# Patient Record
Sex: Male | Born: 1995 | Race: Black or African American | Hispanic: No | Marital: Single | Smoking: Never smoker
Health system: Southern US, Community
[De-identification: ages and names within clinical notes are randomized; demographics above are authoritative.]

## PROBLEM LIST (undated history)

## (undated) DIAGNOSIS — R51 Headache: Secondary | ICD-10-CM

## (undated) DIAGNOSIS — Z789 Other specified health status: Secondary | ICD-10-CM

## (undated) DIAGNOSIS — T7840XA Allergy, unspecified, initial encounter: Secondary | ICD-10-CM

---
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
## 2018-05-28 ENCOUNTER — Encounter (HOSPITAL_COMMUNITY): Payer: Self-pay | Admitting: Behavioral Health

## 2018-05-28 ENCOUNTER — Ambulatory Visit (HOSPITAL_COMMUNITY)
Admission: RE | Admit: 2018-05-28 | Discharge: 2018-05-28 | Disposition: A | Discharge status: Home / Self Care | Payer: Medicaid Other | Attending: Psychiatry | Admitting: Psychiatry

## 2018-05-28 DIAGNOSIS — Z833 Family history of diabetes mellitus: Secondary | ICD-10-CM | POA: Diagnosis not present

## 2018-05-28 DIAGNOSIS — F332 Major depressive disorder, recurrent severe without psychotic features: Secondary | ICD-10-CM | POA: Diagnosis not present

## 2018-05-28 DIAGNOSIS — Z809 Family history of malignant neoplasm, unspecified: Secondary | ICD-10-CM | POA: Insufficient documentation
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
## 2018-05-28 NOTE — H&P (Signed)
Behavioral Health Medical Screening Exam  Adrian Clayton is an 22 y.o. male patient [facility] accompanied by his parents with complaints of episodes of depression and anger that may last for a day before he calms down and passive suicidal thoughts but no intent or plan.  At this time patient denies suicidal/self-harm/homicidal ideation, psychosis, and paranoia.  Patient states he is seeking referral/resources for outpatient psychiatric services    Total Time spent with patient: 30 minutes  Psychiatric Specialty Exam: Physical Exam  Vitals reviewed. Constitutional: He is oriented to person, place, and time. He appears well-developed and well-nourished. No distress.  Neck: Normal range of motion. Neck supple.  Respiratory: Effort normal.  Musculoskeletal: Normal range of motion.  Neurological: He is alert and oriented to person, place, and time.  Skin: Skin is warm and dry.  Psychiatric: His speech is normal and behavior is normal. Judgment and thought content normal. His mood appears anxious. Cognition and memory are normal. He exhibits a depressed mood.    Review of Systems  Psychiatric/Behavioral: Positive for depression (Stable). Hallucinations: Denies. Memory loss: Denies. Substance abuse: Denies. Suicidal ideas: Denies at this time. The patient is nervous/anxious (Stable). Insomnia: Denies.   All other systems reviewed and are negative.   Blood pressure 130/76, temperature 98.8 F (37.1 C), resp. rate 18, SpO2 100 %.There is no height or weight on file to calculate BMI.  General Appearance: Casual  Eye Contact:  Good  Speech:  Clear and Coherent and Normal Rate  Volume:  Normal  Mood:  Anxious and Depressed  Affect:  Congruent  Thought Process:  Coherent and Goal Directed  Orientation:  Full (Time, Place, and Person)  Thought Content:  WDL and Logical  Suicidal Thoughts:  No  Homicidal Thoughts:  No  Memory:  Immediate;   Good Recent;   Good Remote;   Good   Judgement:  Intact  Insight:  Present  Psychomotor Activity:  Normal  Concentration: Concentration: Good and Attention Span: Good  Recall:  Good  Fund of Knowledge:Good  Language: Good  Akathisia:  No  Handed:  Right  AIMS (if indicated):     Assets:  Communication Skills Desire for Improvement Housing Physical Health Resilience Social Support  Sleep:       Musculoskeletal: Strength & Muscle Tone: within normal limits Gait & Station: normal Patient leans: N/A  Blood pressure 130/76, temperature 98.8 F (37.1 C), resp. rate 18, SpO2 100 %.  Recommendations: Outpatient psychiatric services.  Give resources and referral for psychiatric services Disposition: No evidence of imminent risk to self or others at present.   Patient does not meet criteria for psychiatric inpatient admission. Supportive therapy provided about ongoing stressors. Discussed crisis plan, support from social network, calling 911, coming to the Emergency Department, and calling Suicide Hotline.  Based on my evaluation the patient does not appear to have an emergency medical condition.  Shuvon Rankin, NP 05/28/2018, 12:17 PM

## 2019-05-11 ENCOUNTER — Other Ambulatory Visit: Payer: Self-pay

## 2019-05-11 ENCOUNTER — Emergency Department (HOSPITAL_COMMUNITY)
Admission: EM | Admit: 2019-05-11 | Discharge: 2019-05-12 | Disposition: A | Discharge status: Other Acute Inpatient Hospital | Payer: No Typology Code available for payment source | Attending: Emergency Medicine | Admitting: Emergency Medicine

## 2019-05-11 DIAGNOSIS — R45851 Suicidal ideations: Secondary | ICD-10-CM | POA: Insufficient documentation

## 2019-05-11 DIAGNOSIS — Z79899 Other long term (current) drug therapy: Secondary | ICD-10-CM | POA: Insufficient documentation

## 2019-05-11 DIAGNOSIS — T428X2A Poisoning by antiparkinsonism drugs and other central muscle-tone depressants, intentional self-harm, initial encounter: Secondary | ICD-10-CM | POA: Insufficient documentation

## 2019-05-11 DIAGNOSIS — F332 Major depressive disorder, recurrent severe without psychotic features: Secondary | ICD-10-CM | POA: Diagnosis not present

## 2019-05-11 DIAGNOSIS — R404 Transient alteration of awareness: Secondary | ICD-10-CM | POA: Diagnosis not present

## 2019-05-11 DIAGNOSIS — Z20828 Contact with and (suspected) exposure to other viral communicable diseases: Secondary | ICD-10-CM | POA: Insufficient documentation

## 2019-05-11 DIAGNOSIS — M5489 Other dorsalgia: Secondary | ICD-10-CM | POA: Diagnosis not present

## 2019-05-11 DIAGNOSIS — T50904A Poisoning by unspecified drugs, medicaments and biological substances, undetermined, initial encounter: Secondary | ICD-10-CM | POA: Diagnosis not present

## 2019-05-11 DIAGNOSIS — R030 Elevated blood-pressure reading, without diagnosis of hypertension: Secondary | ICD-10-CM

## 2019-05-11 DIAGNOSIS — T50902A Poisoning by unspecified drugs, medicaments and biological substances, intentional self-harm, initial encounter: Secondary | ICD-10-CM

## 2019-05-11 DIAGNOSIS — R52 Pain, unspecified: Secondary | ICD-10-CM | POA: Diagnosis not present

## 2019-05-11 DIAGNOSIS — T887XXA Unspecified adverse effect of drug or medicament, initial encounter: Secondary | ICD-10-CM | POA: Diagnosis not present

## 2019-05-11 LAB — CBC WITH DIFFERENTIAL/PLATELET
Abs Immature Granulocytes: 0.06 10*3/uL (ref 0.00–0.07)
Basophils Absolute: 0.1 10*3/uL (ref 0.0–0.1)
Basophils Relative: 1 %
Eosinophils Absolute: 0.1 10*3/uL (ref 0.0–0.5)
Eosinophils Relative: 1 %
HCT: 47.4 % (ref 39.0–52.0)
Hemoglobin: 15.1 g/dL (ref 13.0–17.0)
Immature Granulocytes: 1 %
Lymphocytes Relative: 35 %
Lymphs Abs: 4 10*3/uL (ref 0.7–4.0)
MCH: 27.4 pg (ref 26.0–34.0)
MCHC: 31.9 g/dL (ref 30.0–36.0)
MCV: 86 fL (ref 80.0–100.0)
Monocytes Absolute: 1.2 10*3/uL — ABNORMAL HIGH (ref 0.1–1.0)
Monocytes Relative: 10 %
Neutro Abs: 6 10*3/uL (ref 1.7–7.7)
Neutrophils Relative %: 52 %
Platelets: 356 10*3/uL (ref 150–400)
RBC: 5.51 MIL/uL (ref 4.22–5.81)
RDW: 11.9 % (ref 11.5–15.5)
WBC: 11.4 10*3/uL — ABNORMAL HIGH (ref 4.0–10.5)
nRBC: 0 % (ref 0.0–0.2)

## 2019-05-11 MED ORDER — SODIUM CHLORIDE 0.9 % IV BOLUS
1000.0000 mL | Freq: Once | INTRAVENOUS | Status: AC
  Administered 2019-05-12: 1000 mL via INTRAVENOUS

## 2019-05-11 MED ORDER — SODIUM CHLORIDE 0.9 % IV SOLN
Freq: Once | INTRAVENOUS | Status: AC
  Administered 2019-05-12: via INTRAVENOUS

## 2019-05-11 NOTE — ED Notes (Signed)
EKG given to EDP,Glick,MD., for review. 

## 2019-05-11 NOTE — ED Provider Notes (Signed)
[facility] DEPT Provider Note   CSN: [id number] Arrival date & time: 05/11/19  2309    History   Chief Complaint Chief Complaint  Patient presents with  . Drug Overdose    HPI Adrian Clayton is a 23 y.o. male.   The history is provided by the patient. The history is limited by the condition of the patient (Psychiatric disorder).  Drug Overdose  He states that he does not want to be here anymore and took an overdose of orphenadrine at about 9:30 PM.  He estimates that he took 15 tablets but is not sure how many.  He does admit to recent depression with crying spells, early morning awakening, anhedonia.  He denies hallucinations.  He does admit to prior suicide attempts.  He denies ethanol use and denies other drug use.  Past Medical History:  Diagnosis Date  . Allergy    seasonal   . Headache(784.0)     There are no active problems to display for this patient.   Past Surgical History:  Procedure Laterality Date  . TOOTH EXTRACTION N/A 11/14/2013   Procedure: EXTRACTION OF WISDOM TEETH AND REMOVAL OF CYST;  Surgeon: Gae Bon, DDS;  Location: [facility];  Service: Oral Surgery;  Laterality: N/A;        Home Medications    Prior to Admission medications   Medication Sig Start Date End Date Taking? Authorizing Provider  amoxicillin-clavulanate (AUGMENTIN) 875-125 MG per tablet Take 1 tablet by mouth 2 (two) times daily. 11/14/13   Diona Browner, DDS  loratadine-pseudoephedrine (CLARITIN-D 24 HOUR) 10-240 MG per 24 hr tablet Take 1 tablet by mouth daily. 11/14/13   Diona Browner, DDS  oxyCODONE-acetaminophen (PERCOCET) 5-325 MG per tablet Take 1-2 tablets by mouth every 4 (four) hours as needed for severe pain. 11/14/13   Diona Browner, DDS    Family History Family History  Problem Relation Age of Onset  . Hypertension Maternal Grandmother   . Diabetes Maternal Grandfather   . Heart disease Maternal Grandfather   . Cancer Maternal  Grandfather   . Diabetes Paternal Grandfather   . Hypertension Paternal Grandfather   . Hypertension Paternal Grandmother     Social History Social History   Tobacco Use  . Smoking status: Never Smoker  . Smokeless tobacco: Never Used  Substance Use Topics  . Alcohol use: No  . Drug use: No     Allergies   Patient has no known allergies.   Review of Systems Review of Systems  Unable to perform ROS: Psychiatric disorder     Physical Exam Updated Vital Signs BP (!) 169/101 (BP Location: Right Arm)   Pulse (!) 107   Temp 97.7 F (36.5 C) (Oral)   Resp (!) 32   SpO2 100%   Physical Exam Vitals signs and nursing note reviewed.    23 year old male, crying, but in no acute distress. Vital signs are significant for elevated respiratory rate, heart rate, blood pressure. Oxygen saturation is 100%, which is normal. Head is normocephalic and atraumatic. PERRLA, EOMI. Oropharynx is clear. Neck is nontender and supple without adenopathy or JVD. Back is nontender and there is no CVA tenderness. Lungs are clear without rales, wheezes, or rhonchi. Chest is nontender. Heart has regular rate and rhythm without murmur. Abdomen is soft, flat, nontender without masses or hepatosplenomegaly and peristalsis is normoactive. Extremities have no cyanosis or edema, full range of motion is present. Skin is warm and dry without rash. Neurologic: Awake and  will answer questions but is emotionally labile, cranial nerves are intact, there are no motor or sensory deficits.  ED Treatments / Results  Labs (all labs ordered are listed, but only abnormal results are displayed) Labs Reviewed - No data to display  EKG EKG Interpretation  Date/Time:  Sunday May 11 2019 23:27:33 EST Ventricular Rate:  94 PR Interval:    QRS Duration: 85 QT Interval:  345 QTC Calculation: 432 R Axis:   60 Text Interpretation: Sinus rhythm ST elev, probable normal early repol pattern No old tracing to  compare Confirmed by Dione Booze ([id number]) on 05/11/2019 11:34:25 PM   Radiology No results found.  Procedures Procedures  CRITICAL CARE Performed by: Dione Booze Total critical care time: 45 minutes Critical care time was exclusive of separately billable procedures and treating other patients. Critical care was necessary to treat or prevent imminent or life-threatening deterioration. Critical care was time spent personally by me on the following activities: development of treatment plan with patient and/or surrogate as well as nursing, discussions with consultants, evaluation of patient's response to treatment, examination of patient, obtaining history from patient or surrogate, ordering and performing treatments and interventions, ordering and review of laboratory studies, ordering and review of radiographic studies, pulse oximetry and re-evaluation of patient's condition.  Medications Ordered in ED Medications  sodium chloride 0.9 % bolus 1,000 mL (0 mLs Intravenous Stopped 05/12/19 0124)  0.9 %  sodium chloride infusion ( Intravenous New Bag/Given 05/12/19 0018)     Initial Impression / Assessment and Plan / ED Course  I have reviewed the triage vital signs and the nursing notes.  Pertinent lab results that were available during my care of the patient were reviewed by me and considered in my medical decision making (see chart for details).  Overdose of orphenadrine with suicidal intent.  Patient is currently awake and alert and protecting his airway appropriately.  Poison control has been consulted, and they recommend hydration and supportive care, observation for 12 hours.  ECG shows sinus tachycardia but with normal intervals.  Old records are reviewed, and he has no relevant past visits.  6:40 AM He has been hemodynamically stable through the night, although blood pressure is still mildly elevated.  He will need to be observed another 3 hours at which point TTS, will be obtained.   Case is signed out to Dr. Juleen China.  Final Clinical Impressions(s) / ED Diagnoses   Final diagnoses:  Suicide attempt by drug overdose (HCC)  Elevated blood pressure reading without diagnosis of hypertension    ED Discharge Orders    None       Dione Booze, MD 05/12/19 [phone]

## 2019-05-11 NOTE — ED Triage Notes (Signed)
Patient arrived via [facility] after taking roughly 15 100mg  Orphenadrine at 930 tonight. Intentional overdose for self harm. Lethargic but alerts to voice.

## 2019-05-12 ENCOUNTER — Encounter (HOSPITAL_COMMUNITY): Payer: Self-pay

## 2019-05-12 ENCOUNTER — Encounter (HOSPITAL_COMMUNITY): Payer: Self-pay | Admitting: Registered Nurse

## 2019-05-12 ENCOUNTER — Other Ambulatory Visit: Payer: Self-pay

## 2019-05-12 ENCOUNTER — Observation Stay (HOSPITAL_COMMUNITY)
Admission: AD | Admit: 2019-05-12 | Discharge: 2019-05-13 | Disposition: A | Discharge status: Home / Self Care | Payer: PRIVATE HEALTH INSURANCE | Source: Intra-hospital | Attending: Psychiatry | Admitting: Psychiatry

## 2019-05-12 DIAGNOSIS — F322 Major depressive disorder, single episode, severe without psychotic features: Secondary | ICD-10-CM | POA: Diagnosis present

## 2019-05-12 DIAGNOSIS — F419 Anxiety disorder, unspecified: Secondary | ICD-10-CM | POA: Diagnosis not present

## 2019-05-12 DIAGNOSIS — F332 Major depressive disorder, recurrent severe without psychotic features: Secondary | ICD-10-CM | POA: Diagnosis present

## 2019-05-12 DIAGNOSIS — R45851 Suicidal ideations: Secondary | ICD-10-CM | POA: Diagnosis not present

## 2019-05-12 HISTORY — DX: Other specified health status: Z78.9

## 2019-05-12 LAB — ETHANOL: Alcohol, Ethyl (B): <10 mg/dL (ref <10)

## 2019-05-12 LAB — COMPREHENSIVE METABOLIC PANEL
ALT: 20 U/L (ref 0–44)
AST: 22 U/L (ref 15–41)
Albumin: 4.6 g/dL (ref 3.5–5.0)
Alkaline Phosphatase: 84 U/L (ref 38–126)
Anion gap: 12 (ref 5–15)
BUN: 13 mg/dL (ref 6–20)
CO2: 23 mmol/L (ref 22–32)
Calcium: 9.5 mg/dL (ref 8.9–10.3)
Chloride: 103 mmol/L (ref 98–111)
Creatinine, Ser: 1.07 mg/dL (ref 0.61–1.24)
GFR calc Af Amer: >60 mL/min (ref >60)
GFR calc non Af Amer: >60 mL/min (ref >60)
Glucose, Bld: 98 mg/dL (ref 70–99)
Potassium: 3.6 mmol/L (ref 3.5–5.1)
Sodium: 138 mmol/L (ref 135–145)
Total Bilirubin: 0.9 mg/dL (ref 0.3–1.2)
Total Protein: 8.1 g/dL (ref 6.5–8.1)

## 2019-05-12 LAB — URINALYSIS, ROUTINE W REFLEX MICROSCOPIC
Bilirubin Urine: NEGATIVE
Glucose, UA: NEGATIVE mg/dL
Hgb urine dipstick: NEGATIVE
Ketones, ur: NEGATIVE mg/dL
Leukocytes,Ua: NEGATIVE
Nitrite: NEGATIVE
Protein, ur: NEGATIVE mg/dL
Specific Gravity, Urine: 1.018 (ref 1.005–1.030)
pH: 7 (ref 5.0–8.0)

## 2019-05-12 LAB — RAPID URINE DRUG SCREEN, HOSP PERFORMED
Amphetamines: NOT DETECTED
Barbiturates: NOT DETECTED
Benzodiazepines: NOT DETECTED
Cocaine: NOT DETECTED
Opiates: NOT DETECTED
Tetrahydrocannabinol: POSITIVE — AB

## 2019-05-12 LAB — ACETAMINOPHEN LEVEL: Acetaminophen (Tylenol), Serum: <10 ug/mL — ABNORMAL LOW (ref 10–30)

## 2019-05-12 LAB — SALICYLATE LEVEL: Salicylate Lvl: <7 mg/dL (ref 2.8–30.0)

## 2019-05-12 LAB — SARS CORONAVIRUS 2 (TAT 6-24 HRS): SARS Coronavirus 2: NEGATIVE

## 2019-05-12 MED ORDER — HYDROXYZINE HCL 25 MG PO TABS: 25.0000 mg | ORAL_TABLET | Freq: Three times a day (TID) | ORAL | Status: DC | PRN

## 2019-05-12 MED ORDER — IBUPROFEN 200 MG PO TABS
600.0000 mg | ORAL_TABLET | Freq: Once | ORAL | Status: AC
  Administered 2019-05-12: 600 mg via ORAL
  Filled 2019-05-12: qty 3

## 2019-05-12 MED ORDER — TRAZODONE HCL 50 MG PO TABS: 50.0000 mg | ORAL_TABLET | Freq: Every evening | ORAL | Status: DC | PRN

## 2019-05-12 NOTE — BH Assessment (Signed)
Assessment Note  Adrian Clayton is an 23 y.o. male.  Per EDP Shuvon, Rankin 05/12/19, "Adrian Clayton,23 y.o.,malepatient seen via tele psych by this provider, Dr. Lucianne MussKumar; and chart reviewed on12/07/20. On evaluation Adrian Gunterreports that he took pills in an attempt to overdose. States that he does not know the name of the medication because it wasn't his. Patient reports stressors are multiple things "financial, family.. I keep a lot of things bottled up in my head; someday's it's negative and I can deal with it and someday's anything just sets me off. I'm over thinking everything and certain things going on in my life."  During TTS assessment pt presented calm and cooperative. Pt denies current SI, HI, AVH, self injurious behaviors and any substance use.  Pt admits to drinking a beer a few nights ago but says he is a social drinker. Pt has no past SI attempts. Pt reports no family history of mental health issues, trauma/abuse. Pt reports no abuse for himself. Pt reports 3 to 4 hours of sleep and a poor appetite. Pt reports symptoms of depression: isolating self for months at a time, anxiety, irritation and insomnia. Pt has no prior psych history. Pt currently has no provider but open to getting therapy. Pt states he bottles up emotions and when stressors build up he does not talk about them and overthinks. Pt states job and family as stressors, pt states that grandmother is sick and he wants to be there for her but works 5 days a week. Pt denied criminal history, pt denied access to weapons. Pt denies history of violence. Pt states he wants to get better and open to getting therapy done to help him balance out his life.  Pt oriented x4.Adrian Clayton. Pt calm/cooperative; and mood is congruent with affect. He does not appear to be responding to internal/external stimuli or delusional thoughts. Patient denies homicidal ideation, psychosis, and paranoia; but continues to endorse suicidal ideation. Patient answered  question appropriately. Pt willing to get help.      Diagnosis: MDD (major depressive disorder), severe   Disposition: Per Nira ConnJason, Berry , NP recommends continued overnight observation, reassess by psychiatry in the am. [facility]    Past Medical History:  Past Medical History:  Diagnosis Date  . Allergy    seasonal   . Headache(784.0)   . Medical history non-contributory     Past Surgical History:  Procedure Laterality Date  . TOOTH EXTRACTION N/A 11/14/2013   Procedure: EXTRACTION OF WISDOM TEETH AND REMOVAL OF CYST;  Surgeon: Georgia LopesScott M Jensen, DDS;  Location: [facility];  Service: Oral Surgery;  Laterality: N/A;    Family History:  Family History  Problem Relation Age of Onset  . Hypertension Maternal Grandmother   . Diabetes Maternal Grandfather   . Heart disease Maternal Grandfather   . Cancer Maternal Grandfather   . Diabetes Paternal Grandfather   . Hypertension Paternal Grandfather   . Hypertension Paternal Grandmother     Social History:  reports that he has never smoked. He has never used smokeless tobacco. He reports that he does not drink alcohol or use drugs.  Additional Social History:  Alcohol / Drug Use Pain Medications: see MAR Prescriptions: see MAR Over the Counter: see MAR History of alcohol / drug use?: No history of alcohol / drug abuse  CIWA: CIWA-Ar BP: (!) 150/82 Pulse Rate: 66 Nausea and Vomiting: no nausea and no vomiting Tactile Disturbances: none Tremor: no tremor Auditory Disturbances: not present Paroxysmal Sweats:  no sweat visible Visual Disturbances: not present Anxiety: no anxiety, at ease Headache, Fullness in Head: none present Agitation: normal activity Orientation and Clouding of Sensorium: oriented and can do serial additions CIWA-Ar Total: 0 COWS:    Allergies: No Known Allergies  Home Medications:  No medications prior to admission.    OB/GYN Status:  No LMP for male patient.  General  Assessment Data Location of Assessment: [facility] Assessment Services TTS Assessment: In system Is this a Tele or Face-to-Face Assessment?: Face-to-Face Is this an Initial Assessment or a Re-assessment for this encounter?: Initial Assessment Patient Accompanied by:: N/A Language Other than English: No Living Arrangements: Other (Comment) What gender do you identify as?: Male Marital status: Other (comment) Living Arrangements: Spouse/significant other Can pt return to current living arrangement?: Yes Admission Status: Voluntary Is patient capable of signing voluntary admission?: Yes Referral Source: Self/Family/Friend Insurance type: none     Crisis Care Plan Living Arrangements: Spouse/significant other Name of Psychiatrist: none Name of Therapist: none     Risk to self with the past 6 months Has patient been a risk to self within the past 6 months prior to admission? : No Suicidal Intent: Yes-Currently Present Has patient had any suicidal intent within the past 6 months prior to admission? : No Is patient at risk for suicide?: No Suicidal Plan?: No Has patient had any suicidal plan within the past 6 months prior to admission? : No Access to Means: No What has been your use of drugs/alcohol within the last 12 months?: none Previous Attempts/Gestures: No How many times?: 0 Other Self Harm Risks: none Triggers for Past Attempts: None known Intentional Self Injurious Behavior: None Family Suicide History: No Recent stressful life event(s): Conflict (Comment), Other (Comment) Persecutory voices/beliefs?: No Depression: Yes Depression Symptoms: Feeling worthless/self pity, Isolating, Insomnia Substance abuse history and/or treatment for substance abuse?: No Suicide prevention information given to non-admitted patients: Not applicable  Risk to Others within the past 6 months Homicidal Ideation: No Does patient have any lifetime risk of violence toward others beyond the six  months prior to admission? : No Thoughts of Harm to Others: No Current Homicidal Intent: No Current Homicidal Plan: No Access to Homicidal Means: No History of harm to others?: No Assessment of Violence: None Noted Does patient have access to weapons?: No Criminal Charges Pending?: No Does patient have a court date: No Is patient on probation?: No  Psychosis Hallucinations: None noted Delusions: None noted  Mental Status Report Appearance/Hygiene: In scrubs Eye Contact: Good Motor Activity: Freedom of movement Speech: Logical/coherent Level of Consciousness: Alert Mood: Pleasant Affect: Appropriate to circumstance Anxiety Level: Minimal Thought Processes: Coherent, Relevant Judgement: Partial Orientation: Person, Place, Time, Situation Obsessive Compulsive Thoughts/Behaviors: None  Cognitive Functioning Concentration: Good Memory: Recent Intact Is patient IDD: No Insight: Good Impulse Control: Poor Appetite: Poor Have you had any weight changes? : No Change Sleep: No Change Total Hours of Sleep: 4 Vegetative Symptoms: None  ADLScreening [facility] Assessment Services) Patient's cognitive ability adequate to safely complete daily activities?: Yes Patient able to express need for assistance with ADLs?: Yes Independently performs ADLs?: Yes (appropriate for developmental age)  Prior Inpatient Therapy Prior Inpatient Therapy: No  Prior Outpatient Therapy Prior Outpatient Therapy: No Does patient have an ACCT team?: No Does patient have Intensive In-House Services?  : No Does patient have [facility] services? : No Does patient have P4CC services?: No  ADL Screening (condition at time of admission) Patient's cognitive ability adequate to safely complete daily activities?:  Yes Is the patient deaf or have difficulty hearing?: No Does the patient have difficulty seeing, even when wearing glasses/contacts?: No Does the patient have difficulty concentrating, remembering, or  making decisions?: No Patient able to express need for assistance with ADLs?: Yes Does the patient have difficulty dressing or bathing?: No Independently performs ADLs?: Yes (appropriate for developmental age) Does the patient have difficulty walking or climbing stairs?: No Weakness of Legs: None Weakness of Arms/Hands: None  Home Assistive Devices/Equipment Home Assistive Devices/Equipment: None  Therapy Consults (therapy consults require a physician order) PT Evaluation Needed: No OT Evalulation Needed: No SLP Evaluation Needed: No Abuse/Neglect Assessment (Assessment to be complete while patient is alone) Abuse/Neglect Assessment Can Be Completed: Yes Physical Abuse: Denies Verbal Abuse: Denies Sexual Abuse: Denies Exploitation of patient/patient's resources: Denies Self-Neglect: Denies Values / Beliefs Cultural Requests During Hospitalization: None Spiritual Requests During Hospitalization: None Consults Spiritual Care Consult Needed: No Social Work Consult Needed: No Merchant navy officer (For Healthcare) Does Patient Have a Medical Advance Directive?: No Would patient like information on creating a medical advance directive?: No - Patient declined Nutrition Screen- [facility] Adult/[facility]/AP Patient's home diet: Regular Has the patient recently lost weight without trying?: Patient is unsure Has the patient been eating poorly because of a decreased appetite?: Yes Malnutrition Screening Tool Score: 3        Disposition: Per Nira Conn , NP recommends continued overnight observation, reassess by psychiatry in the am. Confirmed status with [facility] Fransico Michael Disposition Initial Assessment Completed for this Encounter: Yes  On Site Evaluation by:  Lacey Jensen, Theresia Majors Reviewed with Physician:  Nira Conn, NP  Claria Dice Aalayah Riles 05/12/2019 8:49 PM

## 2019-05-12 NOTE — ED Notes (Signed)
Patients belongings gathered and placed in assigned cabinets behind nurses station.

## 2019-05-12 NOTE — ED Notes (Signed)
Gave report to [place], Therapist, sports at [facility] observation.

## 2019-05-12 NOTE — Consult Note (Signed)
  [name], 23 y.o., male patient seen via tele psych by this provider, Dr. Dwyane Dee; and chart reviewed on 05/12/19.  On evaluation Lysander Calixte reports that he took pills in an attempt to overdose.  States that he does not know the name of the medication because it wasn't his.  Patient reports stressors are multiple things "financial, family.. I keep a lot of things bottled up in my head; someday's it's negative and I can deal with it and someday's anything just sets me off. I'm over thinking everything and certain things going on in my life." During evaluation Jaiden Dinkins is alert/oriented x 4; calm/cooperative; and mood is congruent with affect.  He does not appear to be responding to internal/external stimuli or delusional thoughts.  Patient denies homicidal ideation, psychosis, and paranoia; but continues to endorse suicidal ideation.  Patient answered question appropriately.    Recommendation: Observation.  Check to see if [facility] has an bed in observation available.  Reassess tomorrow

## 2019-05-12 NOTE — ED Notes (Signed)
Pt walked to restroom w/o assistance. 

## 2019-05-12 NOTE — Plan of Care (Signed)
[facility] Observation Crisis Plan  Reason for Crisis Plan:  Crisis Stabilization   Plan of Care:  Referral for IOP  Family Support:      Current Living Environment:  Living Arrangements: Spouse/significant other  Insurance:   Hospital Account    Name Acct ID Class Status Primary Coverage   Adrian Clayton, Adrian Clayton [id number] [place]        Guarantor Account (for Hospital Account [id number])    Name Relation to Pt Service Area Active? Acct Type   Adrian Clayton Self [facility] Yes Behavioral Health   Address Phone       [address] [place], [place] [address] [phone]          Coverage Information (for Hospital Account [id number])    F/O Payor/Plan Precert #   [employer]    Subscriber Subscriber #   Adrian Clayton, Adrian Clayton [id number]   Address Phone   [address] [place], [place] [address] [phone]      Legal Guardian:     Primary Care Provider:  Wayna Chalet, MD  Current Outpatient Providers:  none  Psychiatrist:     Counselor/Therapist:     Compliant with Medications:  Yes  Additional Information:   Adrian Clayton 12/7/20206:47 PM

## 2019-05-12 NOTE — H&P (Signed)
[facility] Provider Admission PAA/H&P  Patient Identification: Adrian Clayton MRN:  [id number] Date of Evaluation:  05/12/2019 Chief Complaint:  mdd Principal Diagnosis: MDD (major depressive disorder), recurrent severe, without psychosis ([place]) Diagnosis:  Principal Problem:   MDD (major depressive disorder), recurrent severe, without psychosis ([place]) Active Problems:   Suicidal ideation   MDD (major depressive disorder), severe ([place])  History of Present Illness: Adrian Clayton, 23 y.o., male patient seen via tele psych by this provider, Dr. Dwyane Dee; and chart reviewed on 05/12/19.  On evaluation Adrian Clayton reports that he took pills in an attempt to overdose.  States that he does not know the name of the medication because it wasn't his.  Patient reports stressors are multiple things "financial, family.. I keep a lot of things bottled up in my head; someday's it's negative and I can deal with it and someday's anything just sets me off. I'm over thinking everything and certain things going on in my life." During evaluation Adrian Clayton is alert/oriented x 4; calm/cooperative; and mood is congruent with affect.  He does not appear to be responding to internal/external stimuli or delusional thoughts.  Patient denies homicidal ideation, psychosis, and paranoia; but continues to endorse suicidal ideation.  Patient answered question appropriately.  Associated Signs/Symptoms: Depression Symptoms:  depressed mood, anhedonia, recurrent thoughts of death, suicidal thoughts without plan, anxiety, (Hypo) Manic Symptoms:  Distractibility, Impulsivity, Irritable Mood, Anxiety Symptoms:  Excessive Worry, Psychotic Symptoms:  Denies PTSD Symptoms: NA Total Time spent with patient: 30 minutes  Past Psychiatric History: Depression  Is the patient at risk to self? No.  Has the patient been a risk to self in the past 6 months? No.  Has the patient been a risk to self within the distant past? No.  Is  the patient a risk to others? No.  Has the patient been a risk to others in the past 6 months? No.  Has the patient been a risk to others within the distant past? No.   Prior Inpatient Therapy:   Prior Outpatient Therapy:    Alcohol Screening: 1. How often do you have a drink containing alcohol?: Never 2. How many drinks containing alcohol do you have on a typical day when you are drinking?: 1 or 2 3. How often do you have six or more drinks on one occasion?: Never AUDIT-C Score: 0 4. How often during the last year have you found that you were not able to stop drinking once you had started?: Never 5. How often during the last year have you failed to do what was normally expected from you becasue of drinking?: Never 6. How often during the last year have you needed a first drink in the morning to get yourself going after a heavy drinking session?: Never 7. How often during the last year have you had a feeling of guilt of remorse after drinking?: Never 8. How often during the last year have you been unable to remember what happened the night before because you had been drinking?: Never 9. Have you or someone else been injured as a result of your drinking?: No 10. Has a relative or friend or a doctor or another health worker been concerned about your drinking or suggested you cut down?: No Alcohol Use Disorder Identification Test Final Score (AUDIT): 0 Substance Abuse History in the last 12 months:  Yes.   Consequences of Substance Abuse: Family Consequences:  family discord Previous Psychotropic Medications: No  Psychological Evaluations: No  Past Medical History:  Past Medical History:  Diagnosis Date  . Allergy    seasonal   . Headache(784.0)   . Medical history non-contributory     Past Surgical History:  Procedure Laterality Date  . TOOTH EXTRACTION N/A 11/14/2013   Procedure: EXTRACTION OF WISDOM TEETH AND REMOVAL OF CYST;  Surgeon: Georgia Lopes, DDS;  Location: [facility] OR;   Service: Oral Surgery;  Laterality: N/A;   Family History:  Family History  Problem Relation Age of Onset  . Hypertension Maternal Grandmother   . Diabetes Maternal Grandfather   . Heart disease Maternal Grandfather   . Cancer Maternal Grandfather   . Diabetes Paternal Grandfather   . Hypertension Paternal Grandfather   . Hypertension Paternal Grandmother    Family Psychiatric History: Denies  Tobacco Screening:   Social History:  Social History   Substance and Sexual Activity  Alcohol Use No     Social History   Substance and Sexual Activity  Drug Use No    Additional Social History:                           Allergies:  No Known Allergies Lab Results:  Results for orders placed or performed during the hospital encounter of 05/11/19 (from the past 48 hour(s))  Comprehensive metabolic panel     Status: None   Collection Time: 05/11/19 11:44 PM  Result Value Ref Range   Sodium 138 135 - 145 mmol/L   Potassium 3.6 3.5 - 5.1 mmol/L   Chloride 103 98 - 111 mmol/L   CO2 23 22 - 32 mmol/L   Glucose, Bld 98 70 - 99 mg/dL   BUN 13 6 - 20 mg/dL   Creatinine, Ser 4.09 0.61 - 1.24 mg/dL   Calcium 9.5 8.9 - 81.1 mg/dL   Total Protein 8.1 6.5 - 8.1 g/dL   Albumin 4.6 3.5 - 5.0 g/dL   AST 22 15 - 41 U/L   ALT 20 0 - 44 U/L   Alkaline Phosphatase 84 38 - 126 U/L   Total Bilirubin 0.9 0.3 - 1.2 mg/dL   GFR calc non Af Amer >60 >60 mL/min   GFR calc Af Amer >60 >60 mL/min   Anion gap 12 5 - 15    Comment: Performed at [facility], [address] [place], [place] [address]  Ethanol     Status: None   Collection Time: 05/11/19 11:44 PM  Result Value Ref Range   Alcohol, Ethyl (B) <10 <10 mg/dL    Comment: (NOTE) Lowest detectable limit for serum alcohol is 10 mg/dL. For medical purposes only. Performed at [facility], [address], [place], [place] [address]   CBC with Differential     Status: Abnormal   Collection  Time: 05/11/19 11:44 PM  Result Value Ref Range   WBC 11.4 (H) 4.0 - 10.5 K/uL   RBC 5.51 4.22 - 5.81 MIL/uL   Hemoglobin 15.1 13.0 - 17.0 g/dL   HCT 13.0 86.5 - 78.4 %   MCV 86.0 80.0 - 100.0 fL   MCH 27.4 26.0 - 34.0 pg   MCHC 31.9 30.0 - 36.0 g/dL   RDW 69.6 29.5 - 28.4 %   Platelets 356 150 - 400 K/uL   nRBC 0.0 0.0 - 0.2 %   Neutrophils Relative % 52 %   Neutro Abs 6.0 1.7 - 7.7 K/uL   Lymphocytes Relative 35 %   Lymphs Abs 4.0 0.7 -  4.0 K/uL   Monocytes Relative 10 %   Monocytes Absolute 1.2 (H) 0.1 - 1.0 K/uL   Eosinophils Relative 1 %   Eosinophils Absolute 0.1 0.0 - 0.5 K/uL   Basophils Relative 1 %   Basophils Absolute 0.1 0.0 - 0.1 K/uL   Immature Granulocytes 1 %   Abs Immature Granulocytes 0.06 0.00 - 0.07 K/uL    Comment: Performed at [facility], [address] [place], [place] [address]  Salicylate level     Status: None   Collection Time: 05/11/19 11:44 PM  Result Value Ref Range   Salicylate Lvl <7.0 2.8 - 30.0 mg/dL    Comment: Performed at [facility], [address] [place], [place] [address]  Acetaminophen level     Status: Abnormal   Collection Time: 05/11/19 11:44 PM  Result Value Ref Range   Acetaminophen (Tylenol), Serum <10 (L) 10 - 30 ug/mL    Comment: (NOTE) Therapeutic concentrations vary significantly. A range of 10-30 ug/mL  may be an effective concentration for many patients. However, some  are best treated at concentrations outside of this range. Acetaminophen concentrations >150 ug/mL at 4 hours after ingestion  and >50 ug/mL at 12 hours after ingestion are often associated with  toxic reactions. Performed at [facility], [address] [place], [place] [address]   Urinalysis, Routine w reflex microscopic     Status: None   Collection Time: 05/11/19 11:45 PM  Result Value Ref Range   Color, Urine YELLOW YELLOW   APPearance CLEAR CLEAR   Specific Gravity, Urine 1.018 1.005  - 1.030   pH 7.0 5.0 - 8.0   Glucose, UA NEGATIVE NEGATIVE mg/dL   Hgb urine dipstick NEGATIVE NEGATIVE   Bilirubin Urine NEGATIVE NEGATIVE   Ketones, ur NEGATIVE NEGATIVE mg/dL   Protein, ur NEGATIVE NEGATIVE mg/dL   Nitrite NEGATIVE NEGATIVE   Leukocytes,Ua NEGATIVE NEGATIVE    Comment: Performed at [facility], [address] [place], [place] [address]  Urine rapid drug screen (hosp performed)     Status: Abnormal   Collection Time: 05/11/19 11:45 PM  Result Value Ref Range   Opiates NONE DETECTED NONE DETECTED   Cocaine NONE DETECTED NONE DETECTED   Benzodiazepines NONE DETECTED NONE DETECTED   Amphetamines NONE DETECTED NONE DETECTED   Tetrahydrocannabinol POSITIVE (A) NONE DETECTED   Barbiturates NONE DETECTED NONE DETECTED    Comment: (NOTE) DRUG SCREEN FOR MEDICAL PURPOSES ONLY.  IF CONFIRMATION IS NEEDED FOR ANY PURPOSE, NOTIFY LAB WITHIN 5 DAYS. LOWEST DETECTABLE LIMITS FOR URINE DRUG SCREEN Drug Class                     Cutoff (ng/mL) Amphetamine and metabolites    1000 Barbiturate and metabolites    200 Benzodiazepine                 200 Tricyclics and metabolites     300 Opiates and metabolites        300 Cocaine and metabolites        300 THC                            50 Performed at [facility], [address] [place], [place] [address]   SARS CORONAVIRUS 2 (TAT 6-24 HRS) Nasopharyngeal Nasopharyngeal Swab     Status: None   Collection Time: 05/11/19 11:49 PM   Specimen:  Nasopharyngeal Swab  Result Value Ref Range   SARS Coronavirus 2 NEGATIVE NEGATIVE    Comment: (NOTE) SARS-CoV-2 target nucleic acids are NOT DETECTED. The SARS-CoV-2 RNA is generally detectable in upper and lower respiratory specimens during the acute phase of infection. Negative results do not preclude SARS-CoV-2 infection, do not rule out co-infections with other pathogens, and should not be used as the sole basis for treatment or other  patient management decisions. Negative results must be combined with clinical observations, patient history, and epidemiological information. The expected result is Negative. Fact Sheet for Patients: HairSlick.no Fact Sheet for Healthcare Providers: quierodirigir.com This test is not yet approved or cleared by the [place] FDA and  has been authorized for detection and/or diagnosis of SARS-CoV-2 by FDA under an Emergency Use Authorization (EUA). This EUA will remain  in effect (meaning this test can be used) for the duration of the COVID-19 declaration under Section 56 4(b)(1) of the Act, 21 U.S.C. section 360bbb-3(b)(1), unless the authorization is terminated or revoked sooner. Performed at [facility], [address] [place], [place] [address]     Blood Alcohol level:  Lab Results  Component Value Date   ETH <10 05/11/2019    Metabolic Disorder Labs:  No results found for: HGBA1C, MPG No results found for: PROLACTIN No results found for: CHOL, TRIG, HDL, CHOLHDL, VLDL, LDLCALC  Current Medications: No current facility-administered medications for this encounter.    PTA Medications: No medications prior to admission.    Musculoskeletal: Strength & Muscle Tone: within normal limits Gait & Station: normal Patient leans: N/A  Psychiatric Specialty Exam: Physical Exam  ROS  Blood pressure (!) 150/82, pulse 66, temperature 98.7 F (37.1 C), temperature source Oral, resp. rate 18, height 5\' 8"  (1.727 m), weight 106.6 kg, SpO2 100 %.Body mass index is 35.73 kg/m.  General Appearance: Casual  Eye Contact:  Good  Speech:  Clear and Coherent and Normal Rate  Volume:  Normal  Mood:  Depressed  Affect:  Congruent and Depressed  Thought Process:  Coherent, Goal Directed and Descriptions of Associations: Intact  Orientation:  Full (Time, Place, and Person)  Thought Content:  WDL  Suicidal Thoughts:  Yes.   with intent/plan  Homicidal Thoughts:  No  Memory:  Immediate;   Good Recent;   Good  Judgement:  Intact  Insight:  Fair and Present  Psychomotor Activity:  Normal  Concentration:  Concentration: Good and Attention Span: Good  Recall:  Good  Fund of Knowledge:  Good  Language:  Good  Akathisia:  No  Handed:  Right  AIMS (if indicated):     Assets:  Communication Skills Desire for Improvement Housing Social Support  ADL's:  Intact  Cognition:  WNL  Sleep:         Treatment Plan Summary: Plan Observe overnight reassess tomorrow   Observation Level/Precautions:  15 minute checks Laboratory:  CBC Chemistry Profile UDS UA Psychotherapy:  As needed Medications:  No medications started at this time Consultations:  As needed Discharge Concerns:  Safety, stabilization, and access to medication Estimated LOS: Over night Other:       , NP 12/7/20206:58 PM

## 2019-05-12 NOTE — BH Assessment (Signed)
[place] Assessment Progress Note  Per Shuvon Rankin, FNP, this pt would benefit from admission to the [facility] Observation Unit at this time.  Heather, RN has assigned pt to Obs Rm 402-1.  Pt has signed Voluntary Admission and Consent for Treatment, as well as Consent to Release Information to pt's mother, and signed forms have been faxed to [facility].  Pt's nurse has been notified, and agrees to send original paperwork along with pt via [facility], and to call report to [phone].  Jalene Mullet, [place] Coordinator [phone]

## 2019-05-12 NOTE — ED Notes (Addendum)
Explained to patient about going to [facility] observation. Also, called [facility] for patient.

## 2019-05-12 NOTE — Progress Notes (Signed)
Pt is a 23 year old male admitted to the observation unit.  Pt reported that he has felt overwhelmed and depressed lately and he attempted to overdose on his girlfriend's muscle relaxants a few nights ago. Pt reported that he was not sure how many pills he took.   Pt said that he has been dealing with many recent stressors in his life such as  financial difficulties, recent move from [place] to [place] for work, his grandmother has been significantly ill, and both of his parents (who are divorced) recently got remarried suddently which pt said that he had no idea either parent "was even thinking about getting remarried".  Pt said that he had an episode a few years ago when he got very depressed and had suicidal thoughts but he had no plan.  Pt said that his father intervened during this episode before pt could attempt to do anything to harm himself. RN conducted skin assessment, no skin issues were noted.    RN oriented pt to the unit, room and staff. RN initiated q 15 minute safety checks.

## 2019-05-12 NOTE — ED Notes (Signed)
[facility] is here to pick patient up for transport. He ambulated with sitter to the front ED entrance with belongings.

## 2019-05-12 NOTE — ED Notes (Signed)
Updated Adrian Clayton from poison control about patients care, will follow up again at 1000

## 2019-05-12 NOTE — ED Notes (Signed)
Meal tray was offered. Patient declined food.

## 2019-05-13 ENCOUNTER — Encounter (HOSPITAL_COMMUNITY): Payer: Self-pay | Admitting: Registered Nurse

## 2019-05-13 DIAGNOSIS — F332 Major depressive disorder, recurrent severe without psychotic features: Secondary | ICD-10-CM | POA: Diagnosis not present

## 2019-05-13 DIAGNOSIS — R45851 Suicidal ideations: Secondary | ICD-10-CM | POA: Diagnosis not present

## 2019-05-13 DIAGNOSIS — T50902A Poisoning by unspecified drugs, medicaments and biological substances, intentional self-harm, initial encounter: Secondary | ICD-10-CM

## 2019-05-13 MED ORDER — TRAZODONE HCL 50 MG PO TABS: 50.0000 mg | ORAL_TABLET | Freq: Every evening | ORAL | 0 refills | Status: DC | PRN

## 2019-05-13 MED ORDER — HYDROXYZINE HCL 10 MG PO TABS: 10.0000 mg | ORAL_TABLET | Freq: Three times a day (TID) | ORAL | 0 refills | Status: DC | PRN

## 2019-05-13 MED ORDER — HYDROXYZINE HCL 10 MG PO TABS: 10.0000 mg | ORAL_TABLET | Freq: Three times a day (TID) | ORAL | Status: DC | PRN

## 2019-05-13 MED ORDER — ESCITALOPRAM OXALATE 5 MG PO TABS
5.0000 mg | ORAL_TABLET | Freq: Every day | ORAL | Status: DC
  Administered 2019-05-13: 5 mg via ORAL
  Filled 2019-05-13: qty 1

## 2019-05-13 MED ORDER — ESCITALOPRAM OXALATE 5 MG PO TABS: 5.0000 mg | ORAL_TABLET | Freq: Every day | ORAL | 0 refills | Status: DC

## 2019-05-13 NOTE — Progress Notes (Signed)
Adrian Clayton NOVEL CORONAVIRUS (COVID-19) DAILY CHECK-OFF SYMPTOMS - answer yes or no to each - every day NO YES  Have you had a fever in the past 24 hours?  . Fever (Temp > 37.80C / 100F) X   Have you had any of these symptoms in the past 24 hours? . New Cough .  Sore Throat  .  Shortness of Breath .  Difficulty Breathing .  Unexplained Body Aches   X   Have you had any one of these symptoms in the past 24 hours not related to allergies?   . Runny Nose .  Nasal Congestion .  Sneezing   X   If you have had runny nose, nasal congestion, sneezing in the past 24 hours, has it worsened?  X   EXPOSURES - check yes or no X   Have you traveled outside the state in the past 14 days?  X   Have you been in contact with someone with a confirmed diagnosis of COVID-19 or PUI in the past 14 days without wearing appropriate PPE?  X   Have you been living in the same home as a person with confirmed diagnosis of COVID-19 or a PUI (household contact)?    X   Have you been diagnosed with COVID-19?    X              What to do next: Answered NO to all: Answered YES to anything:   Proceed with unit schedule Follow the [facility] Inpatient Flowsheet.   

## 2019-05-13 NOTE — BH Assessment (Addendum)
[place] Assessment Progress Note  Per Shuvon Rankin, FNP, this pt requires psychiatric hospitalization at this time.  Heather, RN has assigned pt to [facility] Rm 302-2.  Pt's nurse, Louanne Skye, has been notified.  Jalene Mullet, [place] [phone]   Addendum:  After further consideration, Delphia Grates has determined that pt does not require psychiatric hospitalization.  Pt is to be discharged from the [facility] Observation Unit with recommendation to follow up with [facility] in [place], pt's community of residence.  This has been included in pt's discharge instructions.  Pt's nurse, Louanne Skye, has been notified, as has Water quality scientist.  Jalene Mullet, [place] Coordinator [phone]

## 2019-05-13 NOTE — Discharge Summary (Addendum)
[facility] Psych Observation Discharge  05/13/2019 2:38 PM Adrian Clayton  MRN:  [id number] Principal Problem: MDD (major depressive disorder), recurrent severe, without psychosis (HCC) Discharge Diagnoses: Principal Problem:   MDD (major depressive disorder), recurrent severe, without psychosis (HCC) Active Problems:   Suicidal ideation   MDD (major depressive disorder), severe (HCC)   Subjective: Adrian Clayton, 23 y.o., male patient seen via tele psych by this provider, Dr. Jola Babinski; and chart reviewed on 05/13/19.  On evaluation Adrian Clayton reports feeling overwhelmed and depressed about life "financial, work, and family.  Patient denies prior psychiatric history.  States that he has had passive suicidal thoughts off and on but has never attempted to kill himself.  At this time patient denies suicidal/self-harm/homicidal ideation, psychosis, and paranoia.  Patient states that he currently lives with his grandmother, but plans to move in with girlfriend.  Patient states that he is employed also.  Patient seeking outpatient psychiatric services.  "Some days I deal with things just fine and other days they get to me.  I just need someone to talk to."  Patient denies illicit drug use and occasional alcohol use.  Patient gave permission to speak with his girlfriend and grandmother.   During evaluation Adrian Clayton is alert/oriented x 4; calm/cooperative; and mood is congruent with affect.  He does not appear to be responding to internal/external stimuli or delusional thoughts.  Patient denies suicidal/self-harm/homicidal ideation, psychosis, and paranoia.  Patient answered question appropriately.    Collateral information Girlfriend Sharyl Nimrod [phone]) states that she and patient has been dating since August but had seen each other daily at work.  States that she has no concerns that he will hurt himself.  States that patient has complained about his finances stating that "life and bills, is a everyday  struggle; we just work and stay behind"  States that she feels the patient is safe to come home and that he could use outpatient psychiatric services Grandmother Jamse Mead [phone])  States that patient has been living with her for a while and that she has not noticed any problems with patient states "he has been doing good, and he is a big help to me.  He is the only grandchild staying here.  States that she does not feel that patient will do anything to hurt himself and states that as far as she knows patient has never tried to hurt himself or has a psychiatric history.  Total Time spent with patient: 45 minutes  Past Psychiatric History: No prior psych history  Past Medical History:  Past Medical History:  Diagnosis Date  . Allergy    seasonal   . Headache(784.0)   . Medical history non-contributory     Past Surgical History:  Procedure Laterality Date  . TOOTH EXTRACTION N/A 11/14/2013   Procedure: EXTRACTION OF WISDOM TEETH AND REMOVAL OF CYST;  Surgeon: Georgia Lopes, DDS;  Location: [facility] OR;  Service: Oral Surgery;  Laterality: N/A;   Family History:  Family History  Problem Relation Age of Onset  . Hypertension Maternal Grandmother   . Diabetes Maternal Grandfather   . Heart disease Maternal Grandfather   . Cancer Maternal Grandfather   . Diabetes Paternal Grandfather   . Hypertension Paternal Grandfather   . Hypertension Paternal Grandmother    Family Psychiatric  History: Denis Social History:  Social History   Substance and Sexual Activity  Alcohol Use No     Social History   Substance and Sexual Activity  Drug Use No  Social History   Socioeconomic History  . Marital status: Single    Spouse name: Not on file  . Number of children: Not on file  . Years of education: Not on file  . Highest education level: Not on file  Occupational History  . Not on file  Social Needs  . Financial resource strain: Not on file  . Food insecurity    Worry: Not  on file    Inability: Not on file  . Transportation needs    Medical: Not on file    Non-medical: Not on file  Tobacco Use  . Smoking status: Never Smoker  . Smokeless tobacco: Never Used  Substance and Sexual Activity  . Alcohol use: No  . Drug use: No  . Sexual activity: Yes  Lifestyle  . Physical activity    Days per week: Not on file    Minutes per session: Not on file  . Stress: Not on file  Relationships  . Social Musicianconnections    Talks on phone: Not on file    Gets together: Not on file    Attends religious service: Not on file    Active member of club or organization: Not on file    Attends meetings of clubs or organizations: Not on file    Relationship status: Not on file  Other Topics Concern  . Not on file  Social History Narrative   2015-2016 11th grade    Has this patient used any form of tobacco in the last 30 days? (Cigarettes, Smokeless Tobacco, Cigars, and/or Pipes) Prescription not provided because: Patient does not use tobacco products  Current Medications: Current Facility-Administered Medications  Medication Dose Route Frequency Provider Last Rate Last Dose  . escitalopram (LEXAPRO) tablet 5 mg  5 mg Oral Daily Antonieta Pertlary, Khyri Hinzman Lawson, MD   5 mg at 05/13/19 1112  . hydrOXYzine (ATARAX/VISTARIL) tablet 10 mg  10 mg Oral TID PRN Antonieta Pertlary, Marce Schartz Lawson, MD      . traZODone (DESYREL) tablet 50 mg  50 mg Oral QHS,MR X 1 Nira ConnBerry, Jason A, NP       PTA Medications: No medications prior to admission.    Musculoskeletal: Strength & Muscle Tone: within normal limits Gait & Station: normal Patient leans: N/A  Psychiatric Specialty Exam: Physical Exam  Nursing note and vitals reviewed. Constitutional: He is oriented to person, place, and time. He appears well-developed and well-nourished. No distress.  Neck: Normal range of motion.  Respiratory: Effort normal.  Musculoskeletal: Normal range of motion.  Neurological: He is alert and oriented to person, place, and  time.  Skin: Skin is warm and dry.    Review of Systems  Psychiatric/Behavioral: Depression: Stable. Hallucinations: Denies. Memory loss: Denies. Substance abuse: Denies. Suicidal ideas: Denies. Nervous/anxious: Stable. Insomnia: Stable.   All other systems reviewed and are negative.   Blood pressure (!) 150/82, pulse 66, temperature 98.7 F (37.1 C), temperature source Oral, resp. rate 18, height 5\' 8"  (1.727 m), weight 106.6 kg, SpO2 100 %.Body mass index is 35.73 kg/m.  General Appearance: Casual  Eye Contact:  Good  Speech:  Clear and Coherent and Normal Rate  Volume:  Normal  Mood:  "Fine" appropriate  Affect:  Appropriate and Congruent  Thought Process:  Coherent, Goal Directed and Descriptions of Associations: Intact  Orientation:  Full (Time, Place, and Person)  Thought Content:  WDL and Logical  Suicidal Thoughts:  No  Homicidal Thoughts:  No  Memory:  Immediate;   Good Recent;  Good  Judgement:  Intact  Insight:  Present  Psychomotor Activity:  Normal  Concentration:  Concentration: Good and Attention Span: Good  Recall:  Good  Fund of Knowledge:  Good  Language:  Good  Akathisia:  No  Handed:  Right  AIMS (if indicated):     Assets:  Communication Skills Desire for Improvement Financial Resources/Insurance Housing Physical Health Social Support Transportation  ADL's:  Intact  Cognition:  WNL  Sleep:        Demographic Factors:  Male  Loss Factors: NA  Historical Factors: NA  Risk Reduction Factors:   Sense of responsibility to family, Religious beliefs about death, Employed, Living with another person, especially a relative and Positive social support  Continued Clinical Symptoms:  Adjustment disorder   Cognitive Features That Contribute To Risk:  None    Suicide Risk:  Minimal: No identifiable suicidal ideation.  Patients presenting with no risk factors but with morbid ruminations; may be classified as minimal risk based on the severity  of the depressive symptoms   Plan Of Care/Follow-up recommendations:  Activity:  As tolerated Diet:  Heart healthy    Discharge Instructions     For your behavioral health needs, you are advised to follow up with [facility].  Contact them at your earliest opportunity to schedule an intake appointment:       [facility]      [address]      [place], [place] [address]      [phone]      Disposition:  Patient has been psychiatrically cleared No evidence of imminent risk to self or others at present.   Patient does not meet criteria for psychiatric inpatient admission. Supportive therapy provided about ongoing stressors. Discussed crisis plan, support from social network, calling 911, coming to the Emergency Department, and calling Suicide Hotline.  Shuvon Rankin, NP 05/13/2019, 2:38 PM   Patient was seen and examined by myself as well as Ms. Rankin.  Case discussed and plan agreed upon as outlined above after discussion with the patient's girlfriend and grandmother.

## 2019-05-13 NOTE — Discharge Instructions (Signed)
For your behavioral health needs, you are advised to follow up with [facility].  Contact them at your earliest opportunity to schedule an intake appointment:       [facility]      [address]      [place], [place] [address]      [phone] 

## 2019-05-13 NOTE — Progress Notes (Signed)
   05/13/19 0845  Psych Admission Type (Psych Patients Only)  Admission Status Voluntary  Psychosocial Assessment  Patient Complaints None  Eye Contact Brief  Facial Expression Anxious  Affect Appropriate to circumstance  Speech Logical/coherent  Interaction Assertive  Motor Activity Other (Comment) (WDL)  Appearance/Hygiene In hospital gown  Behavior Characteristics Cooperative;Appropriate to situation  Mood Pleasant  Aggressive Behavior  Targets Self  Type of Behavior Other (Comment) (SI attempt)  Effect No apparent injury  Thought Process  Coherency WDL  Content WDL  Delusions None reported or observed  Perception WDL  Hallucination None reported or observed  Judgment WDL  Confusion WDL  Danger to Self  Current suicidal ideation? Denies  Danger to Others  Danger to Others None reported or observed   Pt said that he sleept well last night.  Pt reported that his appetite is improving.  Pt disclosed that his outlook is more bright this morning and feels that his ability to sleep last night helped to improve pt's mood.  Pt denies SI/HI/AVH.  Pt stated that he would benefit from talking to a therapist on a consistent basis when he is discharged from [facility].  RN provided active listening and reassurance. RN answered pt's questions.  Safety checks were maintained q 15 minutes.  Pt remains safe on the unit.  Pt stated that he would let staff know if he needs assistance.

## 2019-05-19 ENCOUNTER — Encounter: Payer: Self-pay | Admitting: Family Medicine

## 2019-05-19 ENCOUNTER — Ambulatory Visit (INDEPENDENT_AMBULATORY_CARE_PROVIDER_SITE_OTHER): Payer: No Typology Code available for payment source | Admitting: Family Medicine

## 2019-05-19 ENCOUNTER — Other Ambulatory Visit: Payer: Self-pay

## 2019-05-19 DIAGNOSIS — F332 Major depressive disorder, recurrent severe without psychotic features: Secondary | ICD-10-CM | POA: Diagnosis not present

## 2019-05-19 NOTE — Patient Instructions (Signed)
Living With Depression Everyone experiences occasional disappointment, sadness, and loss in their lives. When you are feeling down, blue, or sad for at least 2 weeks in a row, it may mean that you have depression. Depression can affect your thoughts and feelings, relationships, daily activities, and physical health. It is caused by changes in the way your brain functions. If you receive a diagnosis of depression, your health care provider will tell you which type of depression you have and what treatment options are available to you. If you are living with depression, there are ways to help you recover from it and also ways to prevent it from coming back. How to cope with lifestyle changes Coping with stress     Stress is your body's reaction to life changes and events, both good and bad. Stressful situations may include:  Getting married.  The death of a spouse.  Losing a job.  Retiring.  Having a baby. Stress can last just a few hours or it can be ongoing. Stress can play a major role in depression, so it is important to learn both how to cope with stress and how to think about it differently. Talk with your health care provider or a counselor if you would like to learn more about stress reduction. He or she may suggest some stress reduction techniques, such as:  Music therapy. This can include creating music or listening to music. Choose music that you enjoy and that inspires you.  Mindfulness-based meditation. This kind of meditation can be done while sitting or walking. It involves being aware of your normal breaths, rather than trying to control your breathing.  Centering prayer. This is a kind of meditation that involves focusing on a spiritual word or phrase. Choose a word, phrase, or sacred image that is meaningful to you and that brings you peace.  Deep breathing. To do this, expand your stomach and inhale slowly through your nose. Hold your breath for 3-5 seconds, then exhale  slowly, allowing your stomach muscles to relax.  Muscle relaxation. This involves intentionally tensing muscles then relaxing them. Choose a stress reduction technique that fits your lifestyle and personality. Stress reduction techniques take time and practice to develop. Set aside 5-15 minutes a day to do them. Therapists can offer training in these techniques. The training may be covered by some insurance plans. Other things you can do to manage stress include:  Keeping a stress diary. This can help you learn what triggers your stress and ways to control your response.  Understanding what your limits are and saying no to requests or events that lead to a schedule that is too full.  Thinking about how you respond to certain situations. You may not be able to control everything, but you can control how you react.  Adding humor to your life by watching funny films or TV shows.  Making time for activities that help you relax and not feeling guilty about spending your time this way.  Medicines Your health care provider may suggest certain medicines if he or she feels that they will help improve your condition. Avoid using alcohol and other substances that may prevent your medicines from working properly (may interact). It is also important to:  Talk with your pharmacist or health care provider about all the medicines that you take, their possible side effects, and what medicines are safe to take together.  Make it your goal to take part in all treatment decisions (shared decision-making). This includes giving input on   the side effects of medicines. It is best if shared decision-making with your health care provider is part of your total treatment plan. If your health care provider prescribes a medicine, you may not notice the full benefits of it for 4-8 weeks. Most people who are treated for depression need to be on medicine for at least 6-12 months after they feel better. If you are taking  medicines as part of your treatment, do not stop taking medicines without first talking to your health care provider. You may need to have the medicine slowly decreased (tapered) over time to decrease the risk of harmful side effects. Relationships Your health care provider may suggest family therapy along with individual therapy and drug therapy. While there may not be family problems that are causing you to feel depressed, it is still important to make sure your family learns as much as they can about your mental health. Having your family's support can help make your treatment successful. How to recognize changes in your condition Everyone has a different response to treatment for depression. Recovery from major depression happens when you have not had signs of major depression for two months. This may mean that you will start to:  Have more interest in doing activities.  Feel less hopeless than you did 2 months ago.  Have more energy.  Overeat less often, or have better or improving appetite.  Have better concentration. Your health care provider will work with you to decide the next steps in your recovery. It is also important to recognize when your condition is getting worse. Watch for these signs:  Having fatigue or low energy.  Eating too much or too little.  Sleeping too much or too little.  Feeling restless, agitated, or hopeless.  Having trouble concentrating or making decisions.  Having unexplained physical complaints.  Feeling irritable, angry, or aggressive. Get help as soon as you or your family members notice these symptoms coming back. How to get support and help from others How to talk with friends and family members about your condition  Talking to friends and family members about your condition can provide you with one way to get support and guidance. Reach out to trusted friends or family members, explain your symptoms to them, and let them know that you are  working with a health care provider to treat your depression. Financial resources Not all insurance plans cover mental health care, so it is important to check with your insurance carrier. If paying for co-pays or counseling services is a problem, search for a local or county mental health care center. They may be able to offer public mental health care services at low or no cost when you are not able to see a private health care provider. If you are taking medicine for depression, you may be able to get the generic form, which may be less expensive. Some makers of prescription medicines also offer help to patients who cannot afford the medicines they need. Follow these instructions at home:   Get the right amount and quality of sleep.  Cut down on using caffeine, tobacco, alcohol, and other potentially harmful substances.  Try to exercise, such as walking or lifting small weights.  Take over-the-counter and prescription medicines only as told by your health care provider.  Eat a healthy diet that includes plenty of vegetables, fruits, whole grains, low-fat dairy products, and lean protein. Do not eat a lot of foods that are high in solid fats, added sugars, or salt.    Keep all follow-up visits as told by your health care provider. This is important. Contact a health care provider if:  You stop taking your antidepressant medicines, and you have any of these symptoms: ? Nausea. ? Headache. ? Feeling lightheaded. ? Chills and body aches. ? Not being able to sleep (insomnia).  You or your friends and family think your depression is getting worse. Get help right away if:  You have thoughts of hurting yourself or others. If you ever feel like you may hurt yourself or others, or have thoughts about taking your own life, get help right away. You can go to your nearest emergency department or call:  Your local emergency services (911 in the [place]  A suicide crisis helpline, such as the  National Suicide Prevention Lifeline at 1-800-273-8255. This is open 24-hours a day. Summary  If you are living with depression, there are ways to help you recover from it and also ways to prevent it from coming back.  Work with your health care team to create a management plan that includes counseling, stress management techniques, and healthy lifestyle habits. This information is not intended to replace advice given to you by your health care provider. Make sure you discuss any questions you have with your health care provider. Document Released: 04/24/2016 Document Revised: 09/13/2018 Document Reviewed: 04/24/2016 Elsevier Patient Education  2020 [employer]  

## 2019-05-23 NOTE — Progress Notes (Signed)
Adrian Clayton - 23 y.o. male MRN [id number]  Date of birth: 04/23/1996  Subjective Chief Complaint  Patient presents with  . Establish Care    Pt would like to est care. Fmla papers.    HPI Adrian Clayton is a 23 y.o. male here today as a new patient for hospital follow up.  He was seen in ED recently for drug overdose.  Review of records indicate that patient was suicidal due to grandmother being ill and financial difficulties and took 15 tabs of orphenadrine that belonged to his girlfriend.  He was held in ED until evaluated by psychiatry.  Once evaluated by psychiatry he no longer expressed SI and was medically cleared for discharge.  He was instructed to follow up [facility] and/or PCP.  He reports he is doing well at this time.  He is arranging to see a therapist.  He denies further SI at this time.  He has good support from his girlfriend. He is treated with lexapro with trazodone at bedtime.  He has vistaril rx as needed for anxiety.  He finds medications to be helpful.  He denies side effects.    ROS:  A comprehensive ROS was completed and negative except as noted per HPI..  Depression screen [facility] 2/9 05/19/2019 05/19/2019  Decreased Interest 1 1  Down, Depressed, Hopeless 1 1  PHQ - 2 Score 2 2  Altered sleeping 3 -  Tired, decreased energy 2 -  Change in appetite 2 -  Feeling bad or failure about yourself  2 -  Trouble concentrating 1 -  Moving slowly or fidgety/restless 0 -  Suicidal thoughts 1 -  PHQ-9 Score 13 -   GAD 7 : Generalized Anxiety Score 05/19/2019  Nervous, Anxious, on Edge 2  Control/stop worrying 1  Worry too much - different things 2  Trouble relaxing 1  Easily annoyed or irritable 2  Afraid - awful might happen 1  Anxiety Difficulty Somewhat difficult      No Known Allergies  Past Medical History:  Diagnosis Date  . Allergy    seasonal   . Headache(784.0)   . Medical history non-contributory     Past Surgical History:  Procedure Laterality Date  .  TOOTH EXTRACTION N/A 11/14/2013   Procedure: EXTRACTION OF WISDOM TEETH AND REMOVAL OF CYST;  Surgeon: Gae Bon, DDS;  Location: [place];  Service: Oral Surgery;  Laterality: N/A;    Social History   Socioeconomic History  . Marital status: Single    Spouse name: Not on file  . Number of children: Not on file  . Years of education: Not on file  . Highest education level: Not on file  Occupational History  . Not on file  Tobacco Use  . Smoking status: Never Smoker  . Smokeless tobacco: Never Used  Substance and Sexual Activity  . Alcohol use: No  . Drug use: No  . Sexual activity: Yes  Other Topics Concern  . Not on file  Social History Narrative   2015-2016 11th grade   Social Determinants of Health   Financial Resource Strain:   . Difficulty of Paying Living Expenses: Not on file  Food Insecurity:   . Worried About Charity fundraiser in the Last Year: Not on file  . Ran Out of Food in the Last Year: Not on file  Transportation Needs:   . Lack of Transportation (Medical): Not on file  . Lack of Transportation (Non-Medical): Not on file  Physical Activity:   .  Days of Exercise per Week: Not on file  . Minutes of Exercise per Session: Not on file  Stress:   . Feeling of Stress : Not on file  Social Connections:   . Frequency of Communication with Friends and Family: Not on file  . Frequency of Social Gatherings with Friends and Family: Not on file  . Attends Religious Services: Not on file  . Active Member of Clubs or Organizations: Not on file  . Attends Banker Meetings: Not on file  . Marital Status: Not on file    Family History  Problem Relation Age of Onset  . Hypertension Maternal Grandmother   . Diabetes Maternal Grandfather   . Heart disease Maternal Grandfather   . Cancer Maternal Grandfather   . Diabetes Paternal Grandfather   . Hypertension Paternal Grandfather   . Hypertension Paternal Grandmother     Health Maintenance  Topic  Date Due  . HIV Screening  01/12/2013  . INFLUENZA VACCINE  09/03/2019 (Originally 01/04/2019)  . TETANUS/TDAP  05/18/2020 (Originally 01/12/2017)    ----------------------------------------------------------------------------------------------------------------------------------------------------------------------------------------------------------------- Physical Exam BP 140/90   Pulse (!) 103   Temp (!) 97.5 F (36.4 C) (Temporal)   Ht 5\' 8"  (1.727 m)   Wt 244 lb 12.8 oz (111 kg)   SpO2 99%   BMI 37.22 kg/m   Physical Exam Constitutional:      Appearance: Normal appearance.  HENT:     Head: Normocephalic and atraumatic.     Mouth/Throat:     Mouth: Mucous membranes are moist.  Cardiovascular:     Rate and Rhythm: Normal rate and regular rhythm.  Pulmonary:     Effort: Pulmonary effort is normal.     Breath sounds: Normal breath sounds.  Skin:    General: Skin is warm and dry.  Neurological:     General: No focal deficit present.  Psychiatric:        Mood and Affect: Mood normal.        Behavior: Behavior normal.     ------------------------------------------------------------------------------------------------------------------------------------------------------------------------------------------------------------------- Assessment and Plan  MDD (major depressive disorder), recurrent severe, without psychosis (HCC) Symptoms stabilized, no longer suicidal.  He is aware to contact our clinic for worsening depression symptoms and 911 if having thought of harming himself or someone else.  HE will continue current medications.  Encouraged to pursue talk therapy.  Return in about 6 weeks (around 06/30/2019) for Depression/anxiety.   This visit occurred during the SARS-CoV-2 public health emergency.  Safety protocols were in place, including screening questions prior to the visit, additional usage of staff PPE, and extensive cleaning of exam room while observing  appropriate contact time as indicated for disinfecting solutions.

## 2019-05-23 NOTE — Assessment & Plan Note (Signed)
Symptoms stabilized, no longer suicidal.  He is aware to contact our clinic for worsening depression symptoms and 911 if having thought of harming himself or someone else.  HE will continue current medications.  Encouraged to pursue talk therapy.  Return in about 6 weeks (around 06/30/2019) for Depression/anxiety.

## 2019-05-26 ENCOUNTER — Ambulatory Visit: Payer: No Typology Code available for payment source | Admitting: Psychology

## 2019-05-27 ENCOUNTER — Telehealth: Payer: Self-pay | Admitting: Family Medicine

## 2019-05-27 ENCOUNTER — Ambulatory Visit: Payer: Medicaid Other | Admitting: Family Medicine

## 2019-05-27 NOTE — Telephone Encounter (Signed)
Copied from [place] [id number]. Topic: General - Inquiry >> May 26, 2019  4:14 PM Berneta Levins wrote: Reason for CRM:   Pt wants to check on the status of his FMLA forms that were given to Dr. Zigmund Daniel at last [place].

## 2019-05-28 ENCOUNTER — Ambulatory Visit: Payer: No Typology Code available for payment source | Admitting: Psychology

## 2019-05-28 NOTE — Telephone Encounter (Signed)
Dr. Zigmund Daniel completed forms today. Faxed forms per pt's request to HR. Faxed to Darsell Hargave to [phone] received a successful confirmation. Pt is aware that this was faxed and requested original to be mailed to him. Address was verified and this was placed in outgoing mail. Copy of forms was placed to be scanned.

## 2019-06-30 ENCOUNTER — Ambulatory Visit: Payer: No Typology Code available for payment source | Admitting: Family Medicine

## 2019-06-30 ENCOUNTER — Other Ambulatory Visit: Payer: Self-pay

## 2019-07-01 ENCOUNTER — Ambulatory Visit (INDEPENDENT_AMBULATORY_CARE_PROVIDER_SITE_OTHER): Payer: Commercial Managed Care - PPO | Admitting: Family Medicine

## 2019-07-01 ENCOUNTER — Encounter: Payer: Self-pay | Admitting: Family Medicine

## 2019-07-01 DIAGNOSIS — F411 Generalized anxiety disorder: Secondary | ICD-10-CM | POA: Diagnosis not present

## 2019-07-01 DIAGNOSIS — F322 Major depressive disorder, single episode, severe without psychotic features: Secondary | ICD-10-CM | POA: Diagnosis not present

## 2019-07-01 MED ORDER — ESCITALOPRAM OXALATE 10 MG PO TABS: 10.0000 mg | ORAL_TABLET | Freq: Every day | ORAL | 3 refills | Status: AC

## 2019-07-01 MED ORDER — HYDROXYZINE HCL 10 MG PO TABS: 10.0000 mg | ORAL_TABLET | Freq: Three times a day (TID) | ORAL | 3 refills | Status: AC | PRN

## 2019-07-01 MED ORDER — TRAZODONE HCL 50 MG PO TABS: 50.0000 mg | ORAL_TABLET | Freq: Every evening | ORAL | 3 refills | Status: AC | PRN

## 2019-07-01 NOTE — Patient Instructions (Addendum)
Health Maintenance Due  Topic Date Due  . HIV Screening  01/12/2013    Depression screen [facility] 2/9 05/19/2019 05/19/2019  Decreased Interest 1 1  Down, Depressed, Hopeless 1 1  PHQ - 2 Score 2 2  Altered sleeping 3 -  Tired, decreased energy 2 -  Change in appetite 2 -  Feeling bad or failure about yourself  2 -  Trouble concentrating 1 -  Moving slowly or fidgety/restless 0 -  Suicidal thoughts 1 -  PHQ-9 Score 13 -    Managing Anxiety, Adult After being diagnosed with an anxiety disorder, you may be relieved to know why you have felt or behaved a certain way. You may also feel overwhelmed about the treatment ahead and what it will mean for your life. With care and support, you can manage this condition and recover from it. How to manage lifestyle changes Managing stress and anxiety  Stress is your body's reaction to life changes and events, both good and bad. Most stress will last just a few hours, but stress can be ongoing and can lead to more than just stress. Although stress can play a major role in anxiety, it is not the same as anxiety. Stress is usually caused by something external, such as a deadline, test, or competition. Stress normally passes after the triggering event has ended.  Anxiety is caused by something internal, such as imagining a terrible outcome or worrying that something will go wrong that will devastate you. Anxiety often does not go away even after the triggering event is over, and it can become long-term (chronic) worry. It is important to understand the differences between stress and anxiety and to manage your stress effectively so that it does not lead to an anxious response. Talk with your health care provider or a counselor to learn more about reducing anxiety and stress. He or she may suggest tension reduction techniques, such as:  Music therapy. This can include creating or listening to music that you enjoy and that inspires you.  Mindfulness-based  meditation. This involves being aware of your normal breaths while not trying to control your breathing. It can be done while sitting or walking.  Centering prayer. This involves focusing on a word, phrase, or sacred image that means something to you and brings you peace.  Deep breathing. To do this, expand your stomach and inhale slowly through your nose. Hold your breath for 3-5 seconds. Then exhale slowly, letting your stomach muscles relax.  Self-talk. This involves identifying thought patterns that lead to anxiety reactions and changing those patterns.  Muscle relaxation. This involves tensing muscles and then relaxing them. Choose a tension reduction technique that suits your lifestyle and personality. These techniques take time and practice. Set aside 5-15 minutes a day to do them. Therapists can offer counseling and training in these techniques. The training to help with anxiety may be covered by some insurance plans. Other things you can do to manage stress and anxiety include:  Keeping a stress/anxiety diary. This can help you learn what triggers your reaction and then learn ways to manage your response.  Thinking about how you react to certain situations. You may not be able to control everything, but you can control your response.  Making time for activities that help you relax and not feeling guilty about spending your time in this way.  Visual imagery and yoga can help you stay calm and relax.  Medicines Medicines can help ease symptoms. Medicines for anxiety include:  Anti-anxiety drugs.  Antidepressants. Medicines are often used as a primary treatment for anxiety disorder. Medicines will be prescribed by a health care provider. When used together, medicines, psychotherapy, and tension reduction techniques may be the most effective treatment. Relationships Relationships can play a big part in helping you recover. Try to spend more time connecting with trusted friends and  family members. Consider going to couples counseling, taking family education classes, or going to family therapy. Therapy can help you and others better understand your condition. How to recognize changes in your anxiety Everyone responds differently to treatment for anxiety. Recovery from anxiety happens when symptoms decrease and stop interfering with your daily activities at home or work. This may mean that you will start to:  Have better concentration and focus. Worry will interfere less in your daily thinking.  Sleep better.  Be less irritable.  Have more energy.  Have improved memory. It is important to recognize when your condition is getting worse. Contact your health care provider if your symptoms interfere with home or work and you feel like your condition is not improving. Follow these instructions at home: Activity  Exercise. Most adults should do the following: ? Exercise for at least 150 minutes each week. The exercise should increase your heart rate and make you sweat (moderate-intensity exercise). ? Strengthening exercises at least twice a week.  Get the right amount and quality of sleep. Most adults need 7-9 hours of sleep each night. Lifestyle   Eat a healthy diet that includes plenty of vegetables, fruits, whole grains, low-fat dairy products, and lean protein. Do not eat a lot of foods that are high in solid fats, added sugars, or salt.  Make choices that simplify your life.  Do not use any products that contain nicotine or tobacco, such as cigarettes, e-cigarettes, and chewing tobacco. If you need help quitting, ask your health care provider.  Avoid caffeine, alcohol, and certain over-the-counter cold medicines. These may make you feel worse. Ask your pharmacist which medicines to avoid. General instructions  Take over-the-counter and prescription medicines only as told by your health care provider.  Keep all follow-up visits as told by your health care  provider. This is important. Where to find support You can get help and support from these sources:  Self-help groups.  Online and [employer].  A trusted spiritual leader.  Couples counseling.  Family education classes.  Family therapy. Where to find more information You may find that joining a support group helps you deal with your anxiety. The following sources can help you locate counselors or support groups near you:  [place]: www.mentalhealth[place].net  Anxiety and Depression Association of [place] (ADAA): https://www.clark.net/  National Alliance on Mental Illness (NAMI): www.nami.org Contact a health care provider if you:  Have a hard time staying focused or finishing daily tasks.  Spend many hours a day feeling worried about everyday life.  Become exhausted by worry.  Start to have headaches, feel tense, or have nausea.  Urinate more than normal.  Have diarrhea. Get help right away if you have:  A racing heart and shortness of breath.  Thoughts of hurting yourself or others. If you ever feel like you may hurt yourself or others, or have thoughts about taking your own life, get help right away. You can go to your nearest emergency department or call:  Your local emergency services (911 in the [place]  A suicide crisis helpline, such as the [place] at 226-433-8501. This is  open 24 hours a day. Summary  Taking steps to learn and use tension reduction techniques can help calm you and help prevent triggering an anxiety reaction.  When used together, medicines, psychotherapy, and tension reduction techniques may be the most effective treatment.  Family, friends, and partners can play a big part in helping you recover from an anxiety disorder. This information is not intended to replace advice given to you by your health care provider. Make sure you discuss any questions you have with your health care  provider. Document Revised: 10/22/2018 Document Reviewed: 10/22/2018 Elsevier Patient Education  2020 [employer].

## 2019-07-01 NOTE — Assessment & Plan Note (Signed)
Depressive symptoms improved, still with some anxiety.  Will increase lexapro to 10mg  Continue vistaril as needed and trazodone at bedtime.  Discussed seeing a counselor, he prefers to defer this for now.  Recommend 8 week f/u to establish with new provider.

## 2019-07-01 NOTE — Progress Notes (Signed)
Adrian Clayton - 24 y.o. male MRN [id number]  Date of birth: Apr 21, 1996  Subjective Chief Complaint  Patient presents with  . Follow-up    Anxiety and depression.  Pt need a return to work note.    HPI Adrian Clayton is a 24 y.o. male here today for follow up for anxiety and depression.  He was hospitalized in 05/2019 for suicide attempt.  He was started on lexapro, vistaril and trazodone at that time.  He reports that he was not taking medication consistently at first but has been much better at taking as directed recently.  He still feels some increased anxiety but depressive symptoms are better. He denies suicidal ideations.  He denies side effects from medication.    Depression screen [facility] 2/9 07/01/2019 05/19/2019 05/19/2019  Decreased Interest 1 1 1   Down, Depressed, Hopeless 0 1 1  PHQ - 2 Score 1 2 2   Altered sleeping 1 3 -  Tired, decreased energy 1 2 -  Change in appetite 0 2 -  Feeling bad or failure about yourself  0 2 -  Trouble concentrating 1 1 -  Moving slowly or fidgety/restless 0 0 -  Suicidal thoughts 0 1 -  PHQ-9 Score 4 13 -  Difficult doing work/chores Somewhat difficult - -   GAD 7 : Generalized Anxiety Score 07/01/2019 05/19/2019  Nervous, Anxious, on Edge 1 2  Control/stop worrying 1 1  Worry too much - different things 1 2  Trouble relaxing 1 1  Restless 0 -  Easily annoyed or irritable 1 2  Afraid - awful might happen 1 1  Total GAD 7 Score 6 -  Anxiety Difficulty Somewhat difficult Somewhat difficult     No Known Allergies  Past Medical History:  Diagnosis Date  . Allergy    seasonal   . Headache(784.0)   . Medical history non-contributory     Past Surgical History:  Procedure Laterality Date  . TOOTH EXTRACTION N/A 11/14/2013   Procedure: EXTRACTION OF WISDOM TEETH AND REMOVAL OF CYST;  Surgeon: Gae Bon, DDS;  Location: [place];  Service: Oral Surgery;  Laterality: N/A;    Social History   Socioeconomic History  . Marital status:  Single    Spouse name: Not on file  . Number of children: Not on file  . Years of education: Not on file  . Highest education level: Not on file  Occupational History  . Not on file  Tobacco Use  . Smoking status: Never Smoker  . Smokeless tobacco: Never Used  Substance and Sexual Activity  . Alcohol use: No  . Drug use: No  . Sexual activity: Yes  Other Topics Concern  . Not on file  Social History Narrative   2015-2016 11th grade   Social Determinants of Health   Financial Resource Strain:   . Difficulty of Paying Living Expenses: Not on file  Food Insecurity:   . Worried About Charity fundraiser in the Last Year: Not on file  . Ran Out of Food in the Last Year: Not on file  Transportation Needs:   . Lack of Transportation (Medical): Not on file  . Lack of Transportation (Non-Medical): Not on file  Physical Activity:   . Days of Exercise per Week: Not on file  . Minutes of Exercise per Session: Not on file  Stress:   . Feeling of Stress : Not on file  Social Connections:   . Frequency of Communication with Friends and Family: Not on  file  . Frequency of Social Gatherings with Friends and Family: Not on file  . Attends Religious Services: Not on file  . Active Member of Clubs or Organizations: Not on file  . Attends Banker Meetings: Not on file  . Marital Status: Not on file    Family History  Problem Relation Age of Onset  . Hypertension Maternal Grandmother   . Diabetes Maternal Grandfather   . Heart disease Maternal Grandfather   . Cancer Maternal Grandfather   . Diabetes Paternal Grandfather   . Hypertension Paternal Grandfather   . Hypertension Paternal Grandmother     Health Maintenance  Topic Date Due  . HIV Screening  01/12/2013  . INFLUENZA VACCINE  09/03/2019 (Originally 01/04/2019)  . TETANUS/TDAP  05/18/2020 (Originally 01/12/2017)     ----------------------------------------------------------------------------------------------------------------------------------------------------------------------------------------------------------------- Physical Exam BP 110/80 (BP Location: Left Arm, Patient Position: Sitting, Cuff Size: Normal)   Pulse 75   Temp (!) 96.8 F (36 C) (Temporal)   Ht 5\' 8"  (1.727 m)   Wt 256 lb (116.1 kg)   SpO2 99%   BMI 38.92 kg/m   Physical Exam Constitutional:      Appearance: Normal appearance.  Cardiovascular:     Rate and Rhythm: Normal rate and regular rhythm.  Pulmonary:     Effort: Pulmonary effort is normal.     Breath sounds: Normal breath sounds.  Neurological:     Mental Status: He is alert.  Psychiatric:        Mood and Affect: Mood normal.        Behavior: Behavior normal.     ------------------------------------------------------------------------------------------------------------------------------------------------------------------------------------------------------------------- Assessment and Plan  MDD (major depressive disorder), single episode, severe (HCC) Depressive symptoms improved, still with some anxiety.  Will increase lexapro to 10mg  Continue vistaril as needed and trazodone at bedtime.  Discussed seeing a counselor, he prefers to defer this for now.  Recommend 8 week f/u to establish with new provider.      This visit occurred during the SARS-CoV-2 public health emergency.  Safety protocols were in place, including screening questions prior to the visit, additional usage of staff PPE, and extensive cleaning of exam room while observing appropriate contact time as indicated for disinfecting solutions.

## 2019-08-28 ENCOUNTER — Other Ambulatory Visit: Payer: Self-pay

## 2019-08-29 ENCOUNTER — Encounter: Payer: Commercial Managed Care - PPO | Admitting: Family Medicine
